# Patient Record
Sex: Male | Born: 1975 | Race: White | Hispanic: No | Marital: Married | Smoking: Former smoker
Health system: Southern US, Community
[De-identification: ages and names within clinical notes are randomized; demographics above are authoritative.]

---

## 2015-04-29 ENCOUNTER — Emergency Department: Payer: PRIVATE HEALTH INSURANCE

## 2015-04-29 ENCOUNTER — Encounter: Payer: Self-pay | Admitting: Emergency Medicine

## 2015-04-29 DIAGNOSIS — S0990XA Unspecified injury of head, initial encounter: Secondary | ICD-10-CM | POA: Diagnosis present

## 2015-04-29 DIAGNOSIS — W01198A Fall on same level from slipping, tripping and stumbling with subsequent striking against other object, initial encounter: Secondary | ICD-10-CM | POA: Insufficient documentation

## 2015-04-29 DIAGNOSIS — S0081XA Abrasion of other part of head, initial encounter: Secondary | ICD-10-CM | POA: Insufficient documentation

## 2015-04-29 DIAGNOSIS — S199XXA Unspecified injury of neck, initial encounter: Secondary | ICD-10-CM | POA: Insufficient documentation

## 2015-04-29 DIAGNOSIS — Y9389 Activity, other specified: Secondary | ICD-10-CM | POA: Diagnosis not present

## 2015-04-29 DIAGNOSIS — Z87891 Personal history of nicotine dependence: Secondary | ICD-10-CM | POA: Insufficient documentation

## 2015-04-29 DIAGNOSIS — Y998 Other external cause status: Secondary | ICD-10-CM | POA: Diagnosis not present

## 2015-04-29 DIAGNOSIS — F10120 Alcohol abuse with intoxication, uncomplicated: Secondary | ICD-10-CM | POA: Diagnosis not present

## 2015-04-29 DIAGNOSIS — Y92002 Bathroom of unspecified non-institutional (private) residence single-family (private) house as the place of occurrence of the external cause: Secondary | ICD-10-CM | POA: Diagnosis not present

## 2015-04-29 LAB — CBC WITH DIFFERENTIAL/PLATELET
BASOS ABS: 0 10*3/uL (ref 0–0.1)
Basophils Relative: 0 %
EOS ABS: 0 10*3/uL (ref 0–0.7)
EOS PCT: 0 %
HCT: 44 % (ref 40.0–52.0)
Hemoglobin: 15.1 g/dL (ref 13.0–18.0)
LYMPHS PCT: 7 %
Lymphs Abs: 0.8 10*3/uL — ABNORMAL LOW (ref 1.0–3.6)
MCH: 30.2 pg (ref 26.0–34.0)
MCHC: 34.4 g/dL (ref 32.0–36.0)
MCV: 87.8 fL (ref 80.0–100.0)
MONO ABS: 0.7 10*3/uL (ref 0.2–1.0)
Monocytes Relative: 5 %
Neutro Abs: 10.8 10*3/uL — ABNORMAL HIGH (ref 1.4–6.5)
Neutrophils Relative %: 88 %
PLATELETS: 250 10*3/uL (ref 150–440)
RBC: 5.01 MIL/uL (ref 4.40–5.90)
RDW: 13.1 % (ref 11.5–14.5)
WBC: 12.4 10*3/uL — AB (ref 3.8–10.6)

## 2015-04-29 LAB — BASIC METABOLIC PANEL
Anion gap: 7 (ref 5–15)
BUN: 15 mg/dL (ref 6–20)
CO2: 22 mmol/L (ref 22–32)
CREATININE: 0.92 mg/dL (ref 0.61–1.24)
Calcium: 8.7 mg/dL — ABNORMAL LOW (ref 8.9–10.3)
Chloride: 107 mmol/L (ref 101–111)
GFR calc Af Amer: >60 mL/min (ref >60)
GLUCOSE: 96 mg/dL (ref 65–99)
POTASSIUM: 3.2 mmol/L — AB (ref 3.5–5.1)
SODIUM: 136 mmol/L (ref 135–145)

## 2015-04-29 LAB — ETHANOL: ALCOHOL ETHYL (B): 59 mg/dL — AB (ref <5)

## 2015-04-29 NOTE — ED Notes (Signed)
C-collar applied to pt.  Pt. Stated his neck hurt.

## 2015-04-29 NOTE — ED Notes (Signed)
Pt. States he was drinking today.  Pt. States when he got up to use bathroom, pt. Stated "I passed out".  Pt. Has minor small laceration to bridge of nose, lt. Cheek, above rt. Eye and chin.  Bleeding controlled at this time.  Pt. Wife states "I found him on floor, had to repeat name multiple times to get response"

## 2015-04-30 ENCOUNTER — Emergency Department
Admission: EM | Admit: 2015-04-30 | Discharge: 2015-04-30 | Disposition: A | Discharge status: Home / Self Care | Payer: PRIVATE HEALTH INSURANCE | Attending: Emergency Medicine | Admitting: Emergency Medicine

## 2015-04-30 DIAGNOSIS — S0081XA Abrasion of other part of head, initial encounter: Secondary | ICD-10-CM

## 2015-04-30 DIAGNOSIS — F10920 Alcohol use, unspecified with intoxication, uncomplicated: Secondary | ICD-10-CM

## 2015-04-30 NOTE — ED Notes (Signed)
MD Stafford at bedside. 

## 2015-04-30 NOTE — Discharge Instructions (Signed)
Alcohol Intoxication Alcohol intoxication occurs when you drink enough alcohol that it affects your ability to function. It can be mild or very severe. Drinking a lot of alcohol in a short time is called binge drinking. This can be very harmful. Drinking alcohol can also be more dangerous if you are taking medicines or other drugs. Some of the effects caused by alcohol may include:  Loss of coordination.  Changes in mood and behavior.  Unclear thinking.  Trouble talking (slurred speech).  Throwing up (vomiting).  Confusion.  Slowed breathing.  Twitching and shaking (seizures).  Loss of consciousness. HOME CARE  Do not drive after drinking alcohol.  Drink enough water and fluids to keep your pee (urine) clear or pale yellow. Avoid caffeine.  Only take medicine as told by your doctor. GET HELP IF:  You throw up (vomit) many times.  You do not feel better after a few days.  You frequently have alcohol intoxication. Your doctor can help decide if you should see a substance use treatment counselor. GET HELP RIGHT AWAY IF:  You become shaky when you stop drinking.  You have twitching and shaking.  You throw up blood. It may look bright red or like coffee grounds.  You notice blood in your poop (bowel movements).  You become lightheaded or pass out (faint). MAKE SURE YOU:   Understand these instructions.  Will watch your condition.  Will get help right away if you are not doing well or get worse.   This information is not intended to replace advice given to you by your health care provider. Make sure you discuss any questions you have with your health care provider.   Document Released: 12/05/2007 Document Revised: 02/18/2013 Document Reviewed: 11/21/2012 Elsevier Interactive Patient Education 2016 Elsevier Inc.  Abrasion An abrasion is a cut or scrape on the outer surface of your skin. An abrasion does not extend through all of the layers of your skin. It is  important to care for your abrasion properly to prevent infection. CAUSES Most abrasions are caused by falling on or gliding across the ground or another surface. When your skin rubs on something, the outer and inner layer of skin rubs off.  SYMPTOMS A cut or scrape is the main symptom of this condition. The scrape may be bleeding, or it may appear red or pink. If there was an associated fall, there may be an underlying bruise. DIAGNOSIS An abrasion is diagnosed with a physical exam. TREATMENT Treatment for this condition depends on how large and deep the abrasion is. Usually, your abrasion will be cleaned with water and mild soap. This removes any dirt or debris that may be stuck. An antibiotic ointment may be applied to the abrasion to help prevent infection. A bandage (dressing) may be placed on the abrasion to keep it clean. You may also need a tetanus shot. HOME CARE INSTRUCTIONS Medicines  Take or apply medicines only as directed by your health care provider.  If you were prescribed an antibiotic ointment, finish all of it even if you start to feel better. Wound Care  Clean the wound with mild soap and water 2-3 times per day or as directed by your health care provider. Pat your wound dry with a clean towel. Do not rub it.  There are many different ways to close and cover a wound. Follow instructions from your health care provider about:  Wound care.  Dressing changes and removal.  Check your wound every day for signs of infection.  Watch for:  Redness, swelling, or pain.  Fluid, blood, or pus. General Instructions  Keep the dressing dry as directed by your health care provider. Do not take baths, swim, use a hot tub, or do anything that would put your wound underwater until your health care provider approves.  If there is swelling, raise (elevate) the injured area above the level of your heart while you are sitting or lying down.  Keep all follow-up visits as directed by  your health care provider. This is important. SEEK MEDICAL CARE IF:  You received a tetanus shot and you have swelling, severe pain, redness, or bleeding at the injection site.  Your pain is not controlled with medicine.  You have increased redness, swelling, or pain at the site of your wound. SEEK IMMEDIATE MEDICAL CARE IF:  You have a red streak going away from your wound.  You have a fever.  You have fluid, blood, or pus coming from your wound.  You notice a bad smell coming from your wound or your dressing.   This information is not intended to replace advice given to you by your health care provider. Make sure you discuss any questions you have with your health care provider.   Document Released: 03/28/2005 Document Revised: 03/09/2015 Document Reviewed: 06/16/2014 Elsevier Interactive Patient Education [employer] Elsevier Inc.

## 2015-04-30 NOTE — ED Provider Notes (Signed)
[facility] Emergency Department Provider Note  ____________________________________________  Time seen: 12:55 AM  I have reviewed the triage vital signs and the nursing notes.   HISTORY  Chief Complaint Fall    HPI Gregory Pruitt is a 39 y.o. male who was drinking heavily today, and became off balance and fell down and is going to the bathroom. He hit his face but did not lose consciousness according to the patient. Afterward he did go to sleep on the floor and was found asleep by his wife. He was difficult to arouse when she found him. He complains of headache and some lower neck pain worse on the left. No other complaints. Last tetanus shot was about 3 or 4 years ago.     History reviewed. No pertinent past medical history.   There are no active problems to display for this patient.    History reviewed. No pertinent past surgical history.   No current outpatient prescriptions on file.   Allergies Review of patient's allergies indicates no known allergies.   No family history on file.  Social History Social History  Substance Use Topics  . Smoking status: Former Games developermoker  . Smokeless tobacco: None  . Alcohol Use: 8.4 oz/week    14 Cans of beer per week    Review of Systems  Constitutional:   No fever or chills. No weight changes Eyes:   No blurry vision or double vision.  ENT:   No sore throat. Cardiovascular:   No chest pain. Respiratory:   No dyspnea or cough. Gastrointestinal:   Negative for abdominal pain, vomiting and diarrhea.  No BRBPR or melena. Genitourinary:   Negative for dysuria, urinary retention, bloody urine, or difficulty urinating. Musculoskeletal:   Positive neck pain Skin:   Negative for rash. Neurological:   Positive for headaches, without focal weakness or numbness. Psychiatric:  No anxiety or depression.   Endocrine:  No hot/cold intolerance, changes in energy, or sleep difficulty.  10-point ROS  otherwise negative.  ____________________________________________   PHYSICAL EXAM:  VITAL SIGNS: ED Triage Vitals  Enc Vitals Group     BP 04/29/15 2252 117/75 mmHg     Pulse Rate 04/29/15 2252 68     Resp 04/29/15 2252 18     Temp 04/29/15 2252 97.5 F (36.4 C)     Temp src --      SpO2 04/29/15 2252 97 %     Weight 04/29/15 2252 170 lb (77.111 kg)     Height 04/29/15 2252 5\' 9"  (1.753 m)     Head Cir --      Peak Flow --      Pain Score 04/29/15 2253 10     Pain Loc --      Pain Edu? --      Excl. in [place]? --      Constitutional:   Alert and oriented. Well appearing and in no distress. Eyes:   No scleral icterus. No conjunctival pallor. PERRL. EOMI ENT   Head:   Normocephalic with small superficial abrasion over the right forehead, right nasal bridge, and left cheek. Hemostatic, no laceration.   Nose:   No congestion/rhinnorhea. No septal hematoma   Mouth/Throat:   MMM, no pharyngeal erythema. No peritonsillar mass. No uvula shift.   Neck:   No stridor. No SubQ emphysema. No meningismus. No midline spinal tenderness. There is some mild tenderness in the left side of the neck in the musculature around the trapezius Hematological/Lymphatic/Immunilogical:   No cervical  lymphadenopathy. Cardiovascular:   RRR. Normal and symmetric distal pulses are present in all extremities. No murmurs, rubs, or gallops. Respiratory:   Normal respiratory effort without tachypnea nor retractions. Breath sounds are clear and equal bilaterally. No wheezes/rales/rhonchi. Gastrointestinal:   Soft and nontender. No distention. There is no CVA tenderness.  No rebound, rigidity, or guarding. Genitourinary:   deferred Musculoskeletal:   Nontender with normal range of motion in all extremities. No joint effusions.  No lower extremity tenderness.  No edema. No midline spinal tenderness Neurologic:   Normal speech and language.  CN 2-10 normal. Motor grossly intact. No pronator drift.  Normal  gait. No gross focal neurologic deficits are appreciated.  Skin:    Skin is warm, dry and intact. No rash noted.  No petechiae, purpura, or bullae. Psychiatric:   Mood and affect are normal. Speech and behavior are normal. Patient exhibits appropriate insight and judgment.  ____________________________________________    LABS (pertinent positives/negatives) (all labs ordered are listed, but only abnormal results are displayed) Labs Reviewed  BASIC METABOLIC PANEL - Abnormal; Notable for the following:    Potassium 3.2 (*)    Calcium 8.7 (*)    All other components within normal limits  CBC WITH DIFFERENTIAL/PLATELET - Abnormal; Notable for the following:    WBC 12.4 (*)    Neutro Abs 10.8 (*)    Lymphs Abs 0.8 (*)    All other components within normal limits  ETHANOL - Abnormal; Notable for the following:    Alcohol, Ethyl (B) 59 (*)    All other components within normal limits   ____________________________________________   EKG    ____________________________________________    RADIOLOGY  CT head face and C-spine unremarkable  ____________________________________________   PROCEDURES   ____________________________________________   INITIAL IMPRESSION / ASSESSMENT AND PLAN / ED COURSE  Pertinent labs & imaging results that were available during my care of the patient were reviewed by me and considered in my medical decision making (see chart for details).  Patient presents with headache and minor facial trauma after a fall while intoxicated. He is currently clinically sober, steady gait clear speech no distress normal vitals. After imaging the C-spine is clear. Patient be discharged home given counseling on concussion and head injury.     ____________________________________________   FINAL CLINICAL IMPRESSION(S) / ED DIAGNOSES  Final diagnoses:  Facial abrasion, initial encounter  Alcohol intoxication, uncomplicated (HCC)      Sharman Cheek,  MD 04/30/15 0107

## 2019-04-03 ENCOUNTER — Other Ambulatory Visit: Payer: Self-pay

## 2019-04-03 DIAGNOSIS — Z20822 Contact with and (suspected) exposure to covid-19: Secondary | ICD-10-CM

## 2019-04-04 LAB — NOVEL CORONAVIRUS, NAA: SARS-CoV-2, NAA: NOT DETECTED

## 2019-08-07 ENCOUNTER — Emergency Department
Admission: EM | Admit: 2019-08-07 | Discharge: 2019-08-07 | Disposition: A | Discharge status: Home / Self Care | Payer: 59 | Attending: Emergency Medicine | Admitting: Emergency Medicine

## 2019-08-07 ENCOUNTER — Encounter: Payer: Self-pay | Admitting: Emergency Medicine

## 2019-08-07 ENCOUNTER — Other Ambulatory Visit: Payer: Self-pay

## 2019-08-07 ENCOUNTER — Emergency Department: Payer: 59

## 2019-08-07 DIAGNOSIS — M25562 Pain in left knee: Secondary | ICD-10-CM | POA: Diagnosis present

## 2019-08-07 DIAGNOSIS — R2242 Localized swelling, mass and lump, left lower limb: Secondary | ICD-10-CM | POA: Diagnosis not present

## 2019-08-07 DIAGNOSIS — M76899 Other specified enthesopathies of unspecified lower limb, excluding foot: Secondary | ICD-10-CM | POA: Diagnosis not present

## 2019-08-07 DIAGNOSIS — Z87891 Personal history of nicotine dependence: Secondary | ICD-10-CM | POA: Insufficient documentation

## 2019-08-07 MED ORDER — TRAMADOL HCL 50 MG PO TABS: 50.0000 mg | ORAL_TABLET | Freq: Four times a day (QID) | ORAL | 0 refills | Status: AC | PRN

## 2019-08-07 NOTE — ED Notes (Signed)
This RN reviewed discharge instructions, follow-up care, prescriptions, cryotherapy, and need for elevation with patient. Patient verbalized understanding of all reviewed information.  Patient stable, with no distress noted at this time. 

## 2019-08-07 NOTE — ED Provider Notes (Signed)
[facility] Emergency Department Provider Note  ____________________________________________  Time seen: Approximately 5:21 PM  I have reviewed the triage vital signs and the nursing notes.   HISTORY  Chief Complaint Knee Pain    HPI Gregory Pruitt is a 44 y.o. male that presents to the emergency department for evaluation of left knee pain for 4 weeks.  Pain is to the front of his knee, just below his kneecap to the left.  No significant trauma.  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  Area is tender to touch.  Patient went to urgent care for knee pain 2 weeks ago and was started on steroids.  Patient had some numbness to his toes when he went to urgent care, which is what prompted his visit to urgent care.  He had 2 more episodes of numbness to his toes. No numbness, tingling today.  Steroids did not help.  No fevers. No swelling.   History reviewed. No pertinent past medical history.  There are no problems to display for this patient.   History reviewed. No pertinent surgical history.  Prior to Admission medications   Medication Sig Start Date End Date Taking? Authorizing Provider  traMADol (ULTRAM) 50 MG tablet Take 1 tablet (50 mg total) by mouth every 6 (six) hours as needed. 08/07/19 08/06/20  Laban Emperor, PA-C    Allergies Patient has no known allergies.  No family history on file.  Social History Social History   Tobacco Use  . Smoking status: Former Smoker  Substance Use Topics  . Alcohol use: Yes    Alcohol/week: 14.0 standard drinks    Types: 14 Cans of beer per week  . Drug use: Not on file     Review of Systems  Constitutional: No fever/chills Gastrointestinal: No nausea, no vomiting.  Musculoskeletal: Positive for knee pain. Skin: Negative for rash, abrasions, lacerations, ecchymosis. Neurological: Negative for tingling, Positive for intermittent  numbness.   ____________________________________________   PHYSICAL EXAM:  VITAL SIGNS: ED Triage Vitals [08/07/19 1657]  Enc Vitals Group     BP (!) 152/95     Pulse Rate (!) 56     Resp 18     Temp 99.1 F (37.3 C)     Temp Source Oral     SpO2 98 %     Weight      Height      Head Circumference      Peak Flow      Pain Score      Pain Loc      Pain Edu?      Excl. in [place]?      Constitutional: Alert and oriented. Well appearing and in no acute distress. Eyes: Conjunctivae are normal. PERRL. EOMI. Head: Atraumatic. ENT:      Ears:      Nose: No congestion/rhinnorhea.      Mouth/Throat: Mucous membranes are moist.  Neck: No stridor. Cardiovascular: Normal rate, regular rhythm.  Good peripheral circulation.  Symmetric pedal pulses.  Foot is warm to touch.  Cap refill less than 3 seconds. Respiratory: Normal respiratory effort without tachypnea or retractions. Lungs CTAB. Good air entry to the bases with no decreased or absent breath sounds. Musculoskeletal: Full range of motion to all extremities. No gross deformities appreciated.  Tenderness to palpation to medial and inferior anterior left knee.  No popliteal tenderness.  No calf tenderness.  No significant swelling.  No overlying erythema. Neurologic:  Normal speech and  language. No gross focal neurologic deficits are appreciated.  Skin:  Skin is warm, dry and intact. No rash noted. Psychiatric: Mood and affect are normal. Speech and behavior are normal. Patient exhibits appropriate insight and judgement.   ____________________________________________   LABS (all labs ordered are listed, but only abnormal results are displayed)  Labs Reviewed - No data to display ____________________________________________  EKG   ____________________________________________  RADIOLOGY Lexine Baton, personally viewed and evaluated these images (plain radiographs) as part of my medical decision making, as well as  reviewing the written report by the radiologist.  US Venous Img Lower Unilateral Left  Result Date: 08/07/2019 CLINICAL DATA:  44 year old male with left lower extremity swelling x2 weeks. EXAM: Left LOWER EXTREMITY VENOUS DOPPLER ULTRASOUND TECHNIQUE: Gray-scale sonography with compression, as well as color and duplex ultrasound, were performed to evaluate the deep venous system(s) from the level of the common femoral vein through the popliteal and proximal calf veins. COMPARISON:  None. FINDINGS: VENOUS Normal compressibility of the common femoral, superficial femoral, and popliteal veins, as well as the visualized calf veins. Visualized portions of profunda femoral vein and great saphenous vein unremarkable. No filling defects to suggest DVT on grayscale or color Doppler imaging. Doppler waveforms show normal direction of venous flow, normal respiratory phasicity and response to augmentation. Limited views of the contralateral common femoral vein are unremarkable. OTHER None. Limitations: none IMPRESSION: No femoropopliteal DVT nor evidence of DVT within the visualized calf veins. If clinical symptoms are inconsistent or if there are persistent or worsening symptoms, further imaging (possibly involving the iliac veins) may be warranted. Electronically Signed   By: Elgie Collard M.D.   On: 08/07/2019 20:43   DG Knee Complete 4 Views Left  Result Date: 08/07/2019 CLINICAL DATA:  Knee pain EXAM: LEFT KNEE - COMPLETE 4+ VIEW COMPARISON:  None. FINDINGS: No fracture or dislocation of the left knee. Joint spaces are well preserved. There is corticated fragmentation of the anterior tibial tuberosity and soft tissue edema about the anterior knee. There is an incidental, cortical based benign fibro-osseous lesion of the distal left femoral metadiaphysis. IMPRESSION: 1.  No fracture or dislocation of the left knee. 2. There is corticated fragmentation of the anterior tibial tuberosity, consistent with chronic  traction enthesopathy, and soft tissue edema about the anterior knee. Electronically Signed   By: Lauralyn Primes M.D.   On: 08/07/2019 18:03    ____________________________________________    PROCEDURES  Procedure(s) performed:    Procedures    Medications - No data to display   ____________________________________________   INITIAL IMPRESSION / ASSESSMENT AND PLAN / ED COURSE  Pertinent labs & imaging results that were available during my care of the patient were reviewed by me and considered in my medical decision making (see chart for details).  Review of the [place] CSRS was performed in accordance of the [facility] prior to dispensing any controlled drugs.   Patient presented to the emergency department for evaluation of knee pain for 4 weeks.  Vital signs and exam are reassuring.  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  Ultrasound negative for DVT.  Knee was Ace wrapped.  Patient will be discharged home with prescriptions for a short course of tramadol. Patient is to follow up with orthopedics as directed. Patient is given ED precautions to return to the ED for any worsening or new symptoms.  Gregory Pruitt was evaluated in Emergency Department on 08/07/2019 for the symptoms described in the history of present illness. He was  evaluated in the context of the global COVID-19 pandemic, which necessitated consideration that the patient might be at risk for infection with the SARS-CoV-2 virus that causes COVID-19. Institutional protocols and algorithms that pertain to the evaluation of patients at risk for COVID-19 are in a state of rapid change based on information released by regulatory bodies including the CDC and federal and state organizations. These policies and algorithms were followed during the patient's care in the ED.   ____________________________________________  FINAL CLINICAL IMPRESSION(S) / ED DIAGNOSES  Final diagnoses:  Enthesopathy of knee       NEW MEDICATIONS STARTED DURING THIS VISIT:  ED Discharge Orders         Ordered    traMADol (ULTRAM) 50 MG tablet  Every 6 hours PRN     08/07/19 2108              This chart was dictated using voice recognition software/Dragon. Despite best efforts to proofread, errors can occur which can change the meaning. Any change was purely unintentional.    Enid Derry, PA-C 08/07/19 2149    Chesley Noon, MD 08/08/19 [place]

## 2019-08-07 NOTE — ED Triage Notes (Signed)
Presents with left knee  Unsure of injury  Thinks he may have bumped it  Ambulates well

## 2019-08-07 NOTE — ED Notes (Signed)
See triage note. Pt states left knee Sx began approx 2 weeks ago. Denies any specific MoI. Pain accompanied by moderate swelling to lateral aspect of knee. Swelling has since resolved but pain has worsened. Pt also reports occasional numbness and tingling to left foot and toes accompanied by pale skin and cold sensation. At time of assessment, strong pedal pulse noted. Cap refill <3 sec. Provider notified.

## 2021-02-19 IMAGING — US US EXTREM LOW VENOUS*L*
1 series · 14 of 24 positions shown · non-contrast
Comparison: None.

CLINICAL DATA: 43-year-old male with left lower extremity swelling
x2 weeks.

EXAM:
Left LOWER EXTREMITY VENOUS DOPPLER ULTRASOUND
TECHNIQUE: Gray-scale sonography with compression, as well as color and duplex
ultrasound, were performed to evaluate the deep venous system(s)
from the level of the common femoral vein through the popliteal and
proximal calf veins.

[Series 1: us extrem low venous*left* · 14 of 34 slices shown]
[im 1/34]
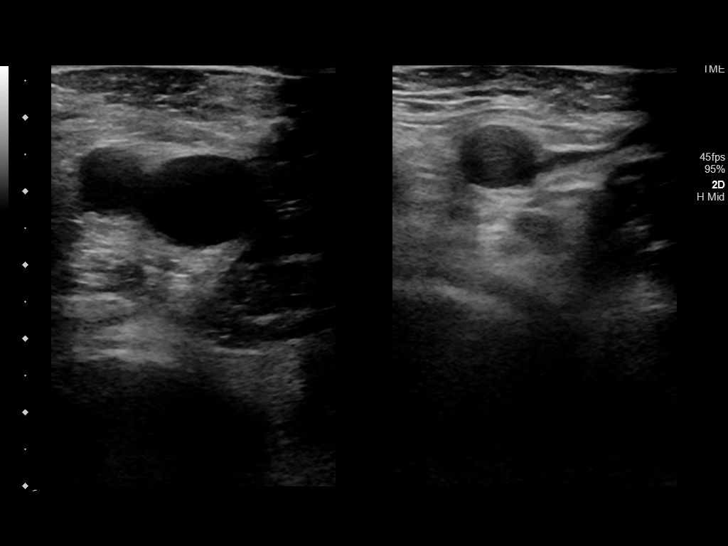
[im 3/34]
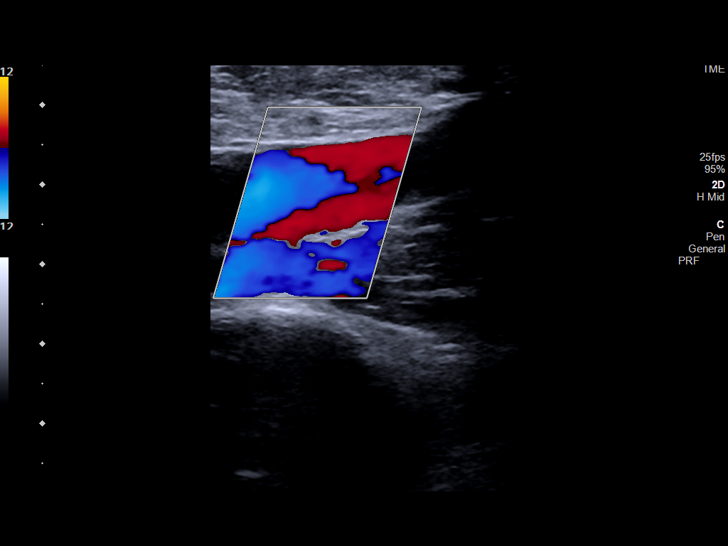
[im 6/34]
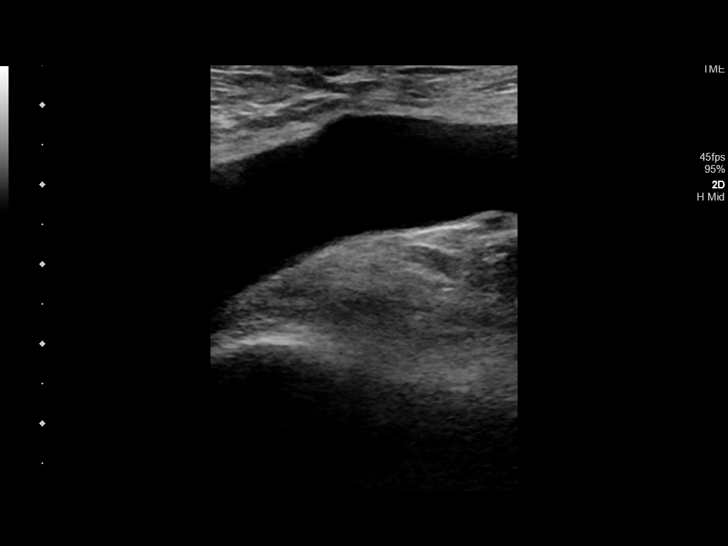
[im 9/34]
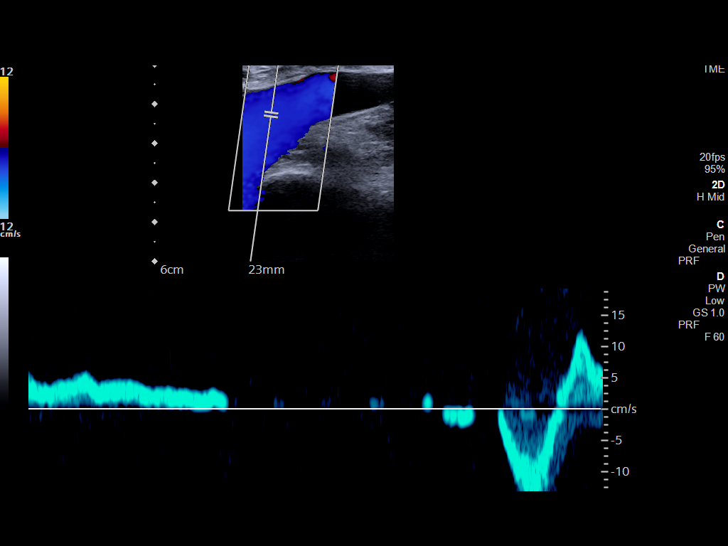
[im 11/34]
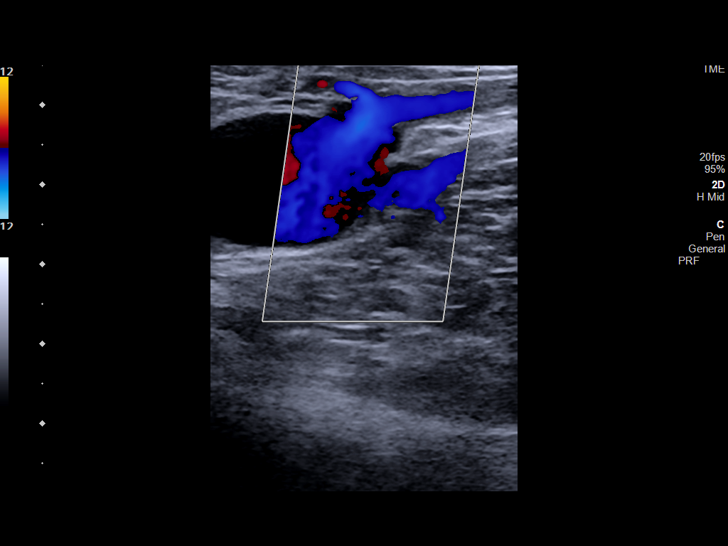
[im 13/34]
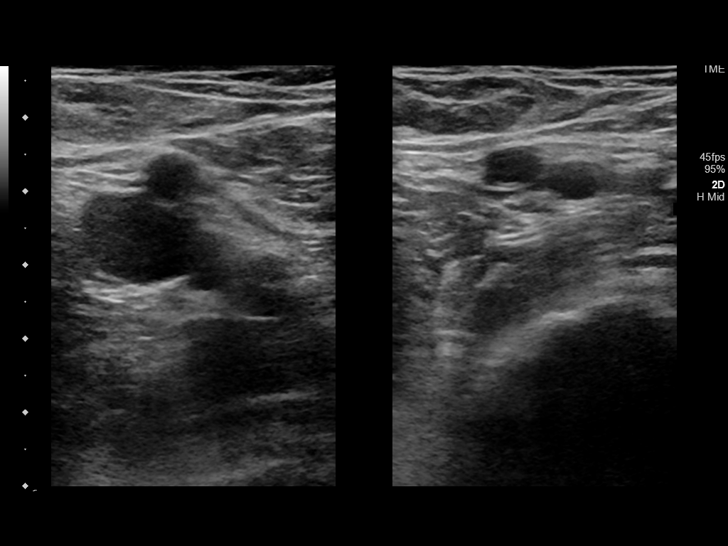
[im 16/34]
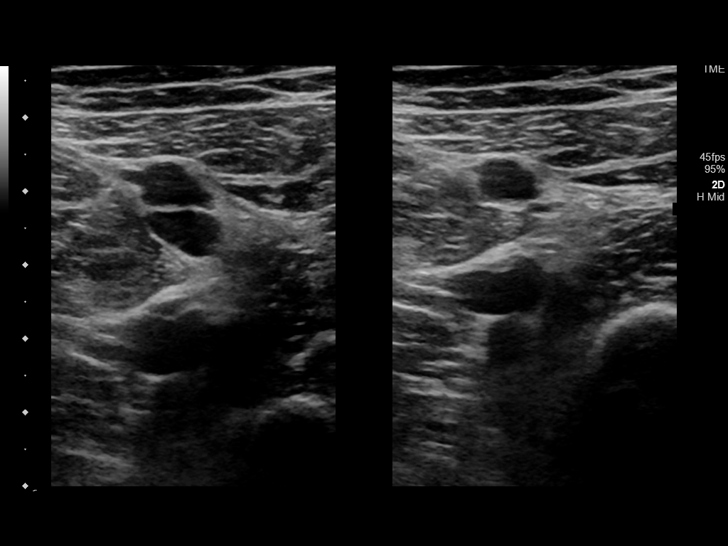
[im 18/34]
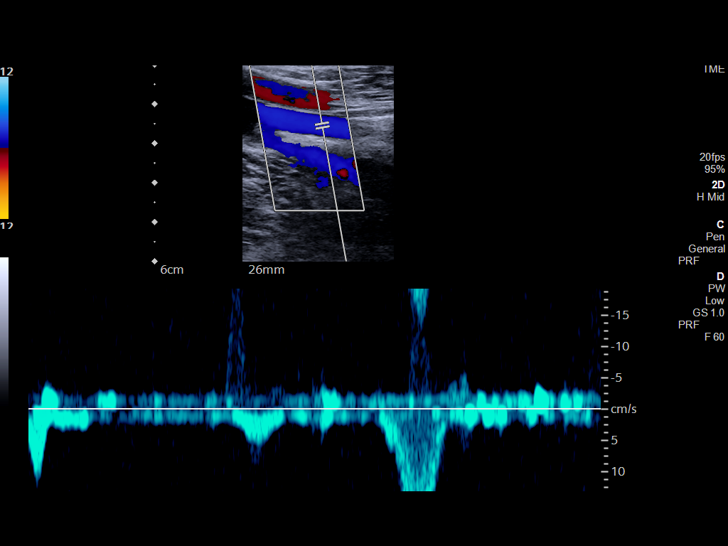
[im 21/34]
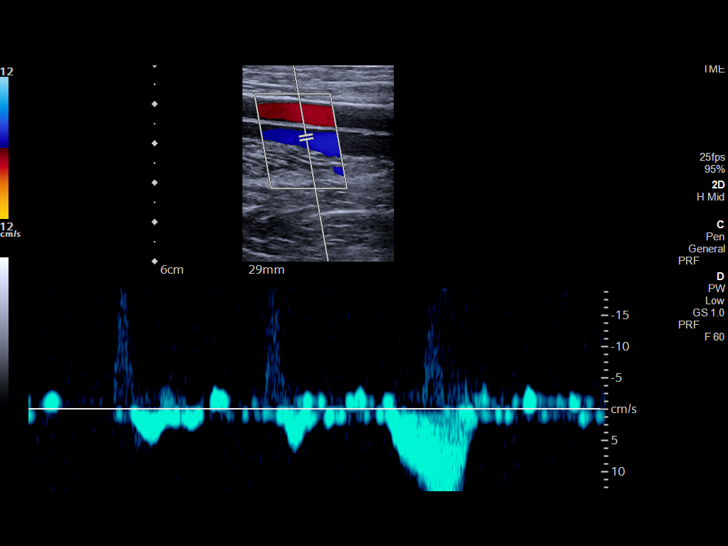
[im 23/34]
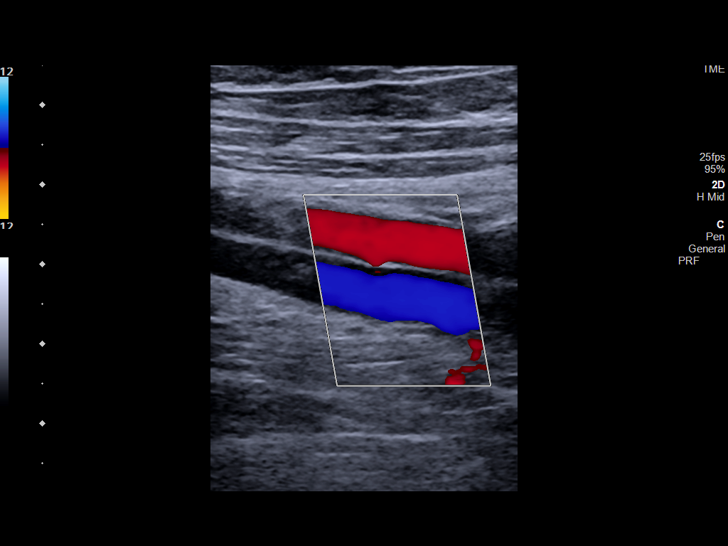
[im 26/34]
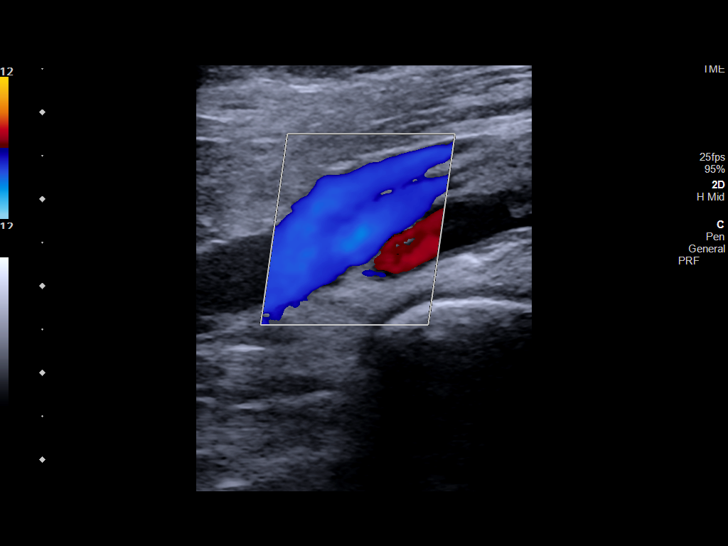
[im 28/34]
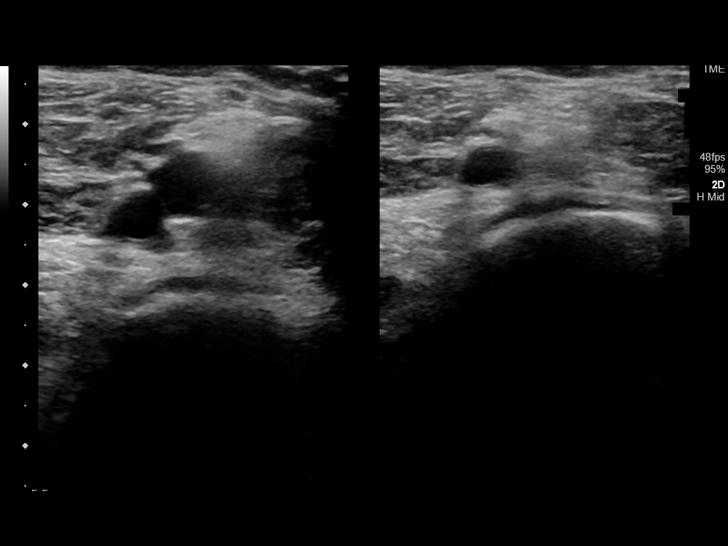
[im 31/34]
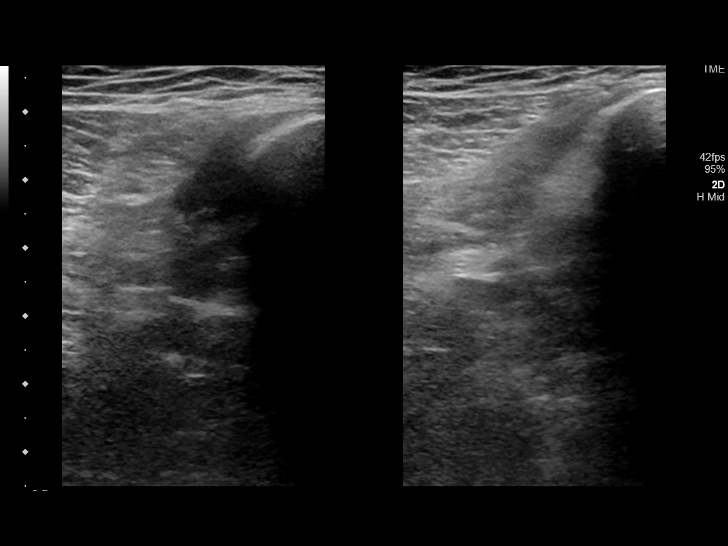
[im 34/34]
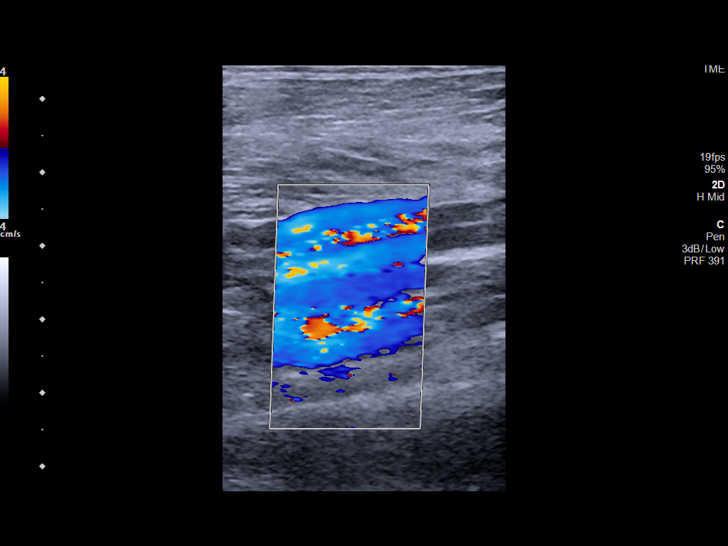

[14 of 24 positions shown; findings below may reference images not displayed]

FINDINGS: VENOUS

Normal compressibility of the common femoral, superficial femoral,
and popliteal veins, as well as the visualized calf veins.
Visualized portions of profunda femoral vein and great saphenous
vein unremarkable. No filling defects to suggest DVT on grayscale or
color Doppler imaging. Doppler waveforms show normal direction of
venous flow, normal respiratory phasicity and response to
augmentation.

Limited views of the contralateral common femoral vein are
unremarkable.

OTHER

None.

Limitations: none
IMPRESSION: No femoropopliteal DVT nor evidence of DVT within the visualized
calf veins.

If clinical symptoms are inconsistent or if there are persistent or
worsening symptoms, further imaging (possibly involving the iliac
veins) may be warranted.

## 2021-02-19 IMAGING — DX DG KNEE COMPLETE 4+V*L*
4 series · 4 of 4 positions shown · non-contrast
Comparison: None.

CLINICAL DATA: Knee pain

EXAM:
LEFT KNEE - COMPLETE 4+ VIEW

[knee ap]
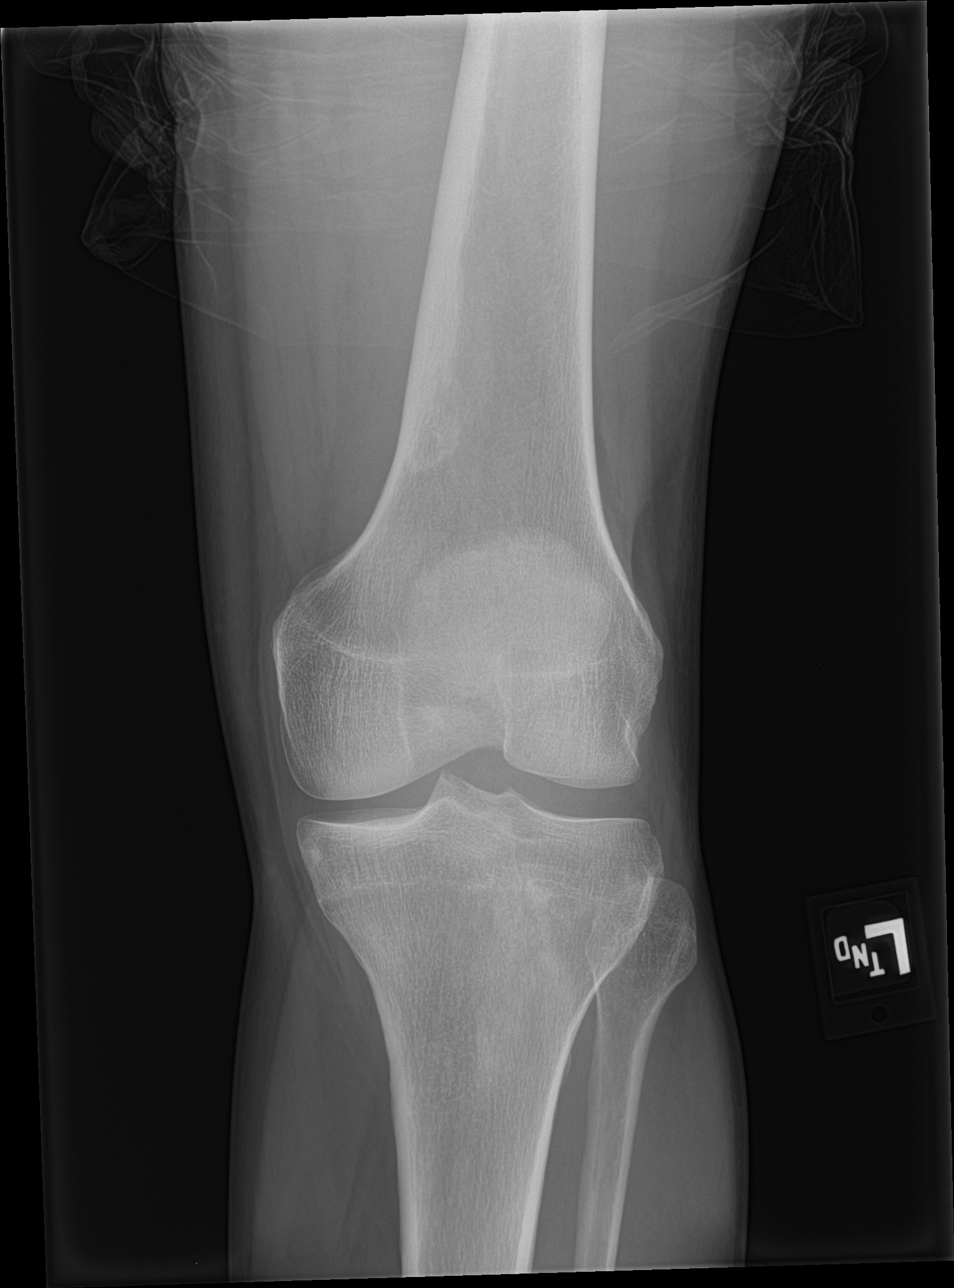

[knee tunnel]
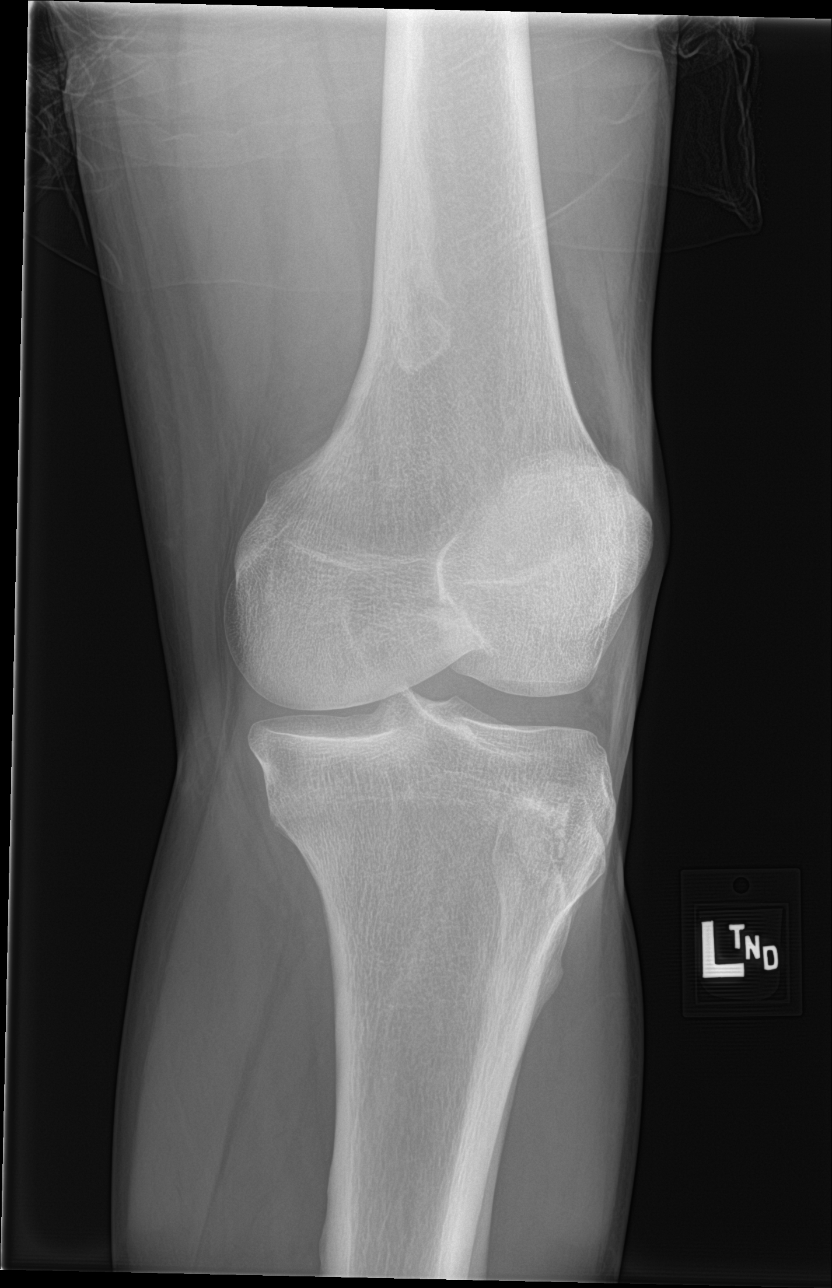

[knee lat]
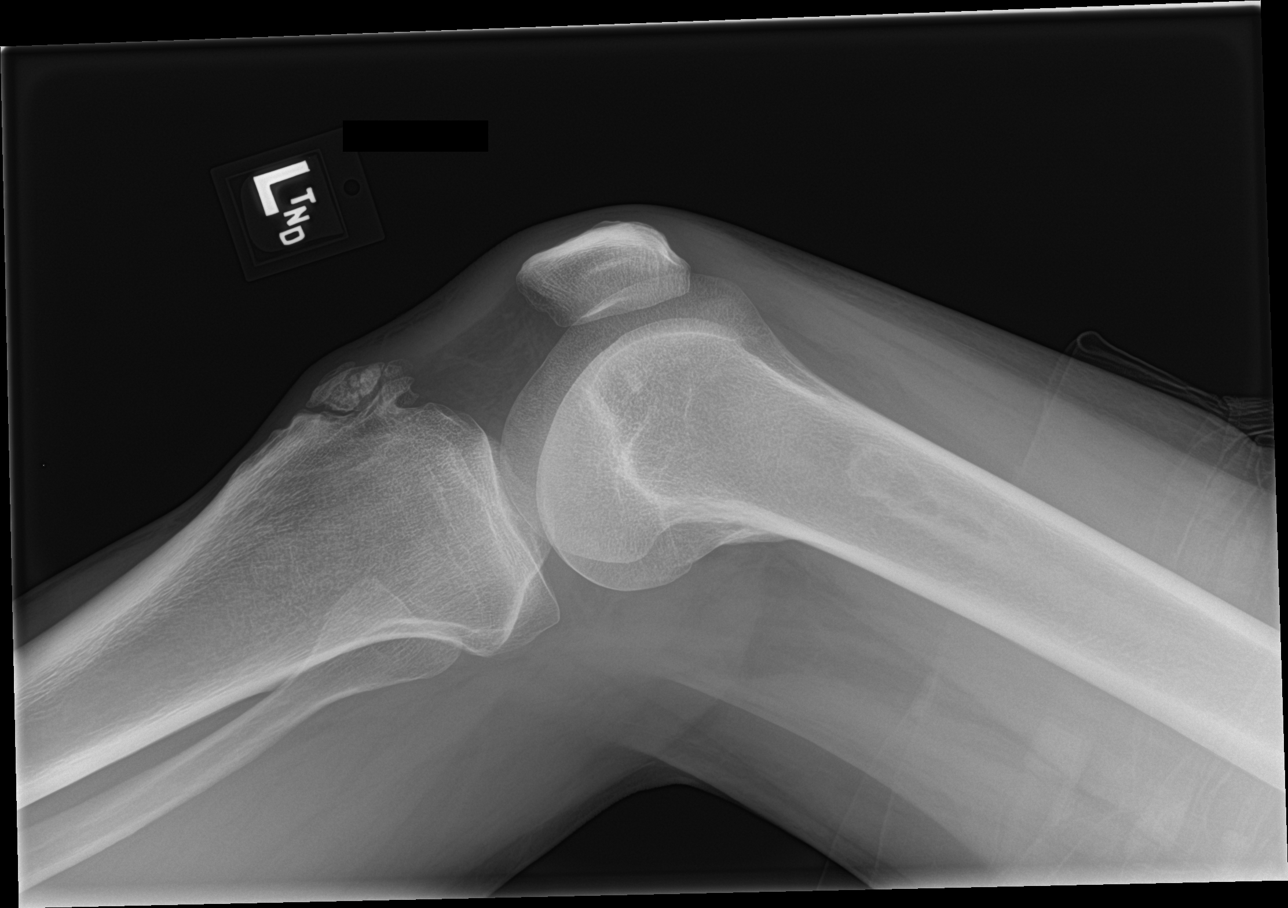

[knee obl]
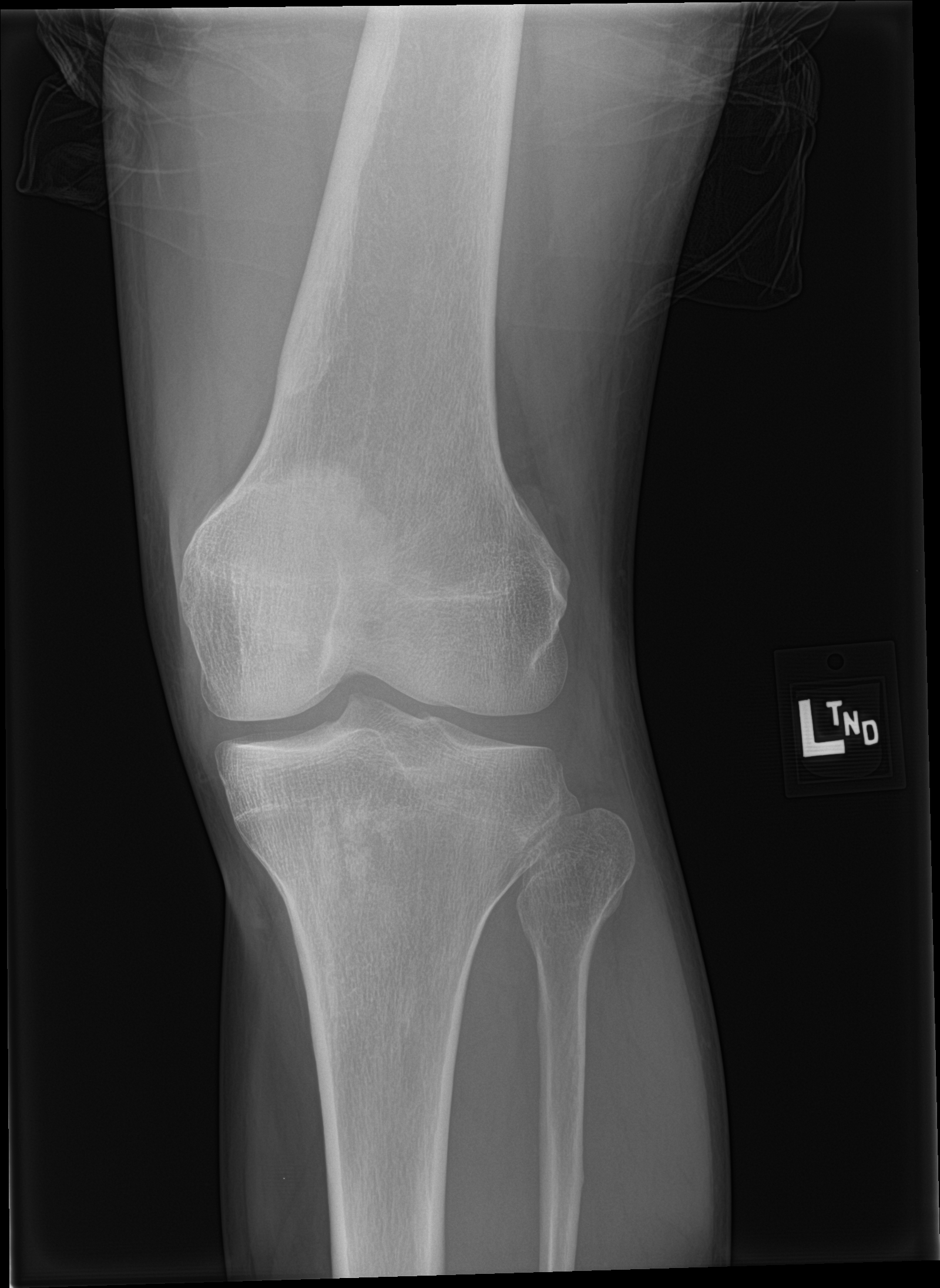

[4 of 4 positions shown; findings below may reference images not displayed]

FINDINGS: No fracture or dislocation of the left knee. Joint spaces are well
preserved. There is corticated fragmentation of the anterior tibial
tuberosity and soft tissue edema about the anterior knee. There is
an incidental, cortical based benign fibro-osseous lesion of the
distal left femoral metadiaphysis.
IMPRESSION: 1.  No fracture or dislocation of the left knee.

2. There is corticated fragmentation of the anterior tibial
tuberosity, consistent with chronic traction enthesopathy, and soft
tissue edema about the anterior knee.
